# Patient Record
Sex: Male | Born: 2005 | Race: Black or African American | Hispanic: No | Marital: Single
Health system: Southern US, Community
[De-identification: ages and names within clinical notes are randomized; demographics above are authoritative.]

## PROBLEM LIST (undated history)

## (undated) DIAGNOSIS — F209 Schizophrenia, unspecified: Secondary | ICD-10-CM

## (undated) DIAGNOSIS — F319 Bipolar disorder, unspecified: Secondary | ICD-10-CM

## (undated) DIAGNOSIS — F840 Autistic disorder: Secondary | ICD-10-CM

## (undated) HISTORY — DX: Autistic disorder: F84.0

---
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
## 2024-01-19 ENCOUNTER — Ambulatory Visit (INDEPENDENT_AMBULATORY_CARE_PROVIDER_SITE_OTHER): Payer: MEDICAID

## 2024-01-19 ENCOUNTER — Encounter (HOSPITAL_COMMUNITY): Payer: Self-pay

## 2024-01-19 MED ORDER — PALIPERIDONE PALMITATE ER 156 MG/ML IM SUSY
156.0000 mg | PREFILLED_SYRINGE | Freq: Once | INTRAMUSCULAR | Status: AC
  Administered 2024-01-19: 10:00:00 156 mg via INTRAMUSCULAR

## 2024-01-19 NOTE — Progress Notes (Signed)
 Patient presented to office with caregiver for injection. Pt tolerated injection of invega  sustenna 156 mg in his LEFT deltoid with no complaints.  Pt presented well groomed and communicative during this appointment. Pt is doing well on current medications. Patient denies SI/HI/AVH. No additional complaints noted. Patient will return in 28 days.

## 2024-02-16 ENCOUNTER — Encounter (HOSPITAL_COMMUNITY): Payer: Self-pay

## 2024-02-16 ENCOUNTER — Ambulatory Visit (HOSPITAL_COMMUNITY): Payer: MEDICAID

## 2024-02-16 ENCOUNTER — Encounter (HOSPITAL_COMMUNITY): Payer: MEDICAID | Admitting: Psychiatry

## 2024-02-16 ENCOUNTER — Ambulatory Visit (INDEPENDENT_AMBULATORY_CARE_PROVIDER_SITE_OTHER): Payer: MEDICAID

## 2024-02-16 DIAGNOSIS — F200 Paranoid schizophrenia: Secondary | ICD-10-CM | POA: Diagnosis not present

## 2024-02-16 DIAGNOSIS — F411 Generalized anxiety disorder: Secondary | ICD-10-CM | POA: Diagnosis not present

## 2024-02-16 MED ORDER — PALIPERIDONE PALMITATE ER 156 MG/ML IM SUSY
156.0000 mg | PREFILLED_SYRINGE | Freq: Once | INTRAMUSCULAR | Status: AC
  Administered 2024-02-16: 156 mg via INTRAMUSCULAR

## 2024-02-16 NOTE — Progress Notes (Signed)
 Patient presented to office with caregiver for injection. Pt tolerated injection of invega  sustenna 156 mg in his Right deltoid with no complaints.  Pt presented well groomed and communicative during this appointment. Pt is doing well on current medications. Patient denies SI/HI/AVH. No additional complaints noted. Patient will return in 28 days.

## 2024-02-21 NOTE — Patient Instructions (Incomplete)

## 2024-02-29 ENCOUNTER — Ambulatory Visit: Payer: MEDICAID | Admitting: Nurse Practitioner

## 2024-03-04 ENCOUNTER — Encounter: Payer: Self-pay | Admitting: Nurse Practitioner

## 2024-03-04 ENCOUNTER — Ambulatory Visit (INDEPENDENT_AMBULATORY_CARE_PROVIDER_SITE_OTHER): Payer: MEDICAID | Admitting: Nurse Practitioner

## 2024-03-04 VITALS — BP 122/80 | HR 71 | Temp 98.5°F | Ht 67.2 in | Wt 216.2 lb

## 2024-03-04 DIAGNOSIS — Z7689 Persons encountering health services in other specified circumstances: Secondary | ICD-10-CM | POA: Diagnosis not present

## 2024-03-04 DIAGNOSIS — F200 Paranoid schizophrenia: Secondary | ICD-10-CM

## 2024-03-04 DIAGNOSIS — I10 Essential (primary) hypertension: Secondary | ICD-10-CM

## 2024-03-04 DIAGNOSIS — Z113 Encounter for screening for infections with a predominantly sexual mode of transmission: Secondary | ICD-10-CM | POA: Diagnosis not present

## 2024-03-04 DIAGNOSIS — Z87828 Personal history of other (healed) physical injury and trauma: Secondary | ICD-10-CM | POA: Insufficient documentation

## 2024-03-04 DIAGNOSIS — F840 Autistic disorder: Secondary | ICD-10-CM

## 2024-03-04 DIAGNOSIS — E669 Obesity, unspecified: Secondary | ICD-10-CM

## 2024-03-04 DIAGNOSIS — R7301 Impaired fasting glucose: Secondary | ICD-10-CM | POA: Diagnosis not present

## 2024-03-04 DIAGNOSIS — E782 Mixed hyperlipidemia: Secondary | ICD-10-CM

## 2024-03-04 DIAGNOSIS — D649 Anemia, unspecified: Secondary | ICD-10-CM | POA: Diagnosis not present

## 2024-03-04 DIAGNOSIS — Z1159 Encounter for screening for other viral diseases: Secondary | ICD-10-CM | POA: Diagnosis not present

## 2024-03-04 LAB — MICROALBUMIN, URINE WAIVED
Creatinine, Urine Waived: 300 mg/dL (ref 10–300)
Microalb, Ur Waived: 30 mg/L — ABNORMAL HIGH (ref 0–19)
Microalb/Creat Ratio: <30 mg/g

## 2024-03-04 NOTE — Assessment & Plan Note (Signed)
 Chronic, stable. Check labs today and add supplement as needed.

## 2024-03-04 NOTE — Assessment & Plan Note (Signed)
 Chronic, stable at present. Denies SI/HI. Follows with psychiatry, will continue this collaboration and medication as ordered by them. Recent note reviewed.

## 2024-03-04 NOTE — Assessment & Plan Note (Signed)
 Chronic, stable at present.  Follows with psychiatry, will continue this collaboration and medication as ordered by them. Recent note reviewed.

## 2024-03-04 NOTE — Assessment & Plan Note (Signed)
 Chronic, stable. BP at goal on recheck. Will continue Amlodipine  as ordered and if psychiatry requests PCP can takeover this medication, will maintain mental health medications with them. Recommend he monitor BP at least a few mornings a week at home and document.  DASH diet at home.  Labs today: CBC, CMP, TSH, urine ALB.

## 2024-03-04 NOTE — Assessment & Plan Note (Signed)
BMI 33.66.  Recommended eating smaller high protein, low fat meals more frequently and exercising 30 mins a day 5 times a week with a goal of 10-15lb weight loss in the next 3 months. Patient voiced their understanding and motivation to adhere to these recommendations. ? ?

## 2024-03-04 NOTE — Patient Instructions (Signed)
 Healthy Eating for Adults Healthy eating may help you get and keep a healthy body weight, reduce the risk of chronic disease, and live a long and productive life. It is important to follow a healthy eating pattern. Your nutritional and calorie needs should be met mainly by different nutrient-rich foods. What are tips for following this plan? Reading food labels Read labels and choose the following: Reduced or low sodium products. Juices with 100% fruit juice. Foods with low saturated fats (<3 g per serving) and high polyunsaturated and monounsaturated fats. Foods with whole grains, such as whole wheat, cracked wheat, brown rice, and wild rice. Whole grains that are fortified with folic acid. This is recommended for females who are pregnant or who want to become pregnant. Read labels and do not eat or drink the following: Foods or drinks with added sugars. These include foods that contain brown sugar, corn sweetener, corn syrup, dextrose, fructose, glucose, high-fructose corn syrup, honey, invert sugar, lactose, malt syrup, maltose, molasses, raw sugar, sucrose, trehalose, or turbinado sugar. Limit your intake of added sugars to less than 10% of your total daily calories. Do not eat more than the following amounts of added sugar per day: 6 teaspoons (25 g) for females. 9 teaspoons (38 g) for males. Foods that contain processed or refined starches and grains. Refined grain products, such as white flour, degermed cornmeal, white bread, and white rice. Shopping Choose nutrient-rich snacks, such as vegetables, whole fruits, and nuts. Avoid high-calorie and high-sugar snacks, such as potato chips, fruit snacks, and candy. Use oil-based dressings and spreads on foods instead of solid fats such as butter, margarine, sour cream, or cream cheese. Limit pre-made sauces, mixes, and instant products such as flavored rice, instant noodles, and ready-made pasta. Try more plant-protein sources, such as tofu,  tempeh, black beans, edamame, lentils, nuts, and seeds. Explore eating plans such as the Mediterranean diet or vegetarian diet. Try heart-healthy dips made with beans and healthy fats like hummus and guacamole. Vegetables go great with these. Cooking Use oil to saut or stir-fry foods instead of solid fats such as butter, margarine, or lard. Try baking, boiling, grilling, or broiling instead of frying. Remove the fatty part of meats before cooking. Steam vegetables in water or broth. Meal planning  At meals, imagine dividing your plate into fourths: One-half of your plate is fruits and vegetables. One-fourth of your plate is whole grains. One-fourth of your plate is protein, especially lean meats, poultry, eggs, tofu, beans, or nuts. Include low-fat dairy as part of your daily diet. Lifestyle Choose healthy options in all settings, including home, work, school, restaurants, or stores. Prepare your food safely: Wash your hands after handling raw meats. Where you prepare food, keep surfaces clean by regularly washing with hot, soapy water. Keep raw meats separate from ready-to-eat foods, such as fruits and vegetables. Cook seafood, meat, poultry, and eggs to the recommended temperature. Get a food thermometer. Store foods at safe temperatures. In general: Keep cold foods at 28F (4.4C) or below. Keep hot foods at 128F (60C) or above. Keep your freezer at [facility] (-17.8C) or below. Foods are not safe to eat if they have been between the temperatures of 40-128F (4.4-60C) for more than 2 hours. What foods should I eat? Fruits Aim to eat 1-2 cups of fresh, canned (in natural juice), or frozen fruits each day. One cup of fruit equals 1 small apple, 1 large banana, 8 large strawberries, 1 cup (237 g) canned fruit,  cup (82 g) dried  fruit, or 1 cup (240 mL) 100% juice. Vegetables Aim to eat 2-4 cups of fresh and frozen vegetables each day, including different varieties and colors. One cup  of vegetables equals 1 cup (91 g) broccoli or cauliflower florets, 2 medium carrots, 2 cups (150 g) raw, leafy greens, 1 large tomato, 1 large bell pepper, 1 large sweet potato, or 1 medium white potato. Grains Aim to eat 5-10 ounce-equivalents of whole grains each day. Examples of 1 ounce-equivalent of grains include 1 slice of bread, 1 cup (40 g) ready-to-eat cereal, 3 cups (24 g) popcorn, or  cup (93 g) cooked rice. Meats and other proteins Try to eat 5-7 ounce-equivalents of protein each day. Examples of 1 ounce-equivalent of protein include 1 egg,  oz nuts (12 almonds, 24 pistachios, or 7 walnut halves), 1/4 cup (90 g) cooked beans, 6 tablespoons (90 g) hummus or 1 tablespoon (16 g) peanut butter. A cut of meat or fish that is the size of a deck of cards is about 3-4 ounce-equivalents (85 g). Of the protein you eat each week, try to have at least 8 sounce (227 g) of seafood. This is about 2 servings per week. This includes salmon, trout, herring, sardines, and anchovies. Dairy Aim to eat 3 cup-equivalents of fat-free or low-fat dairy each day. Examples of 1 cup-equivalent of dairy include 1 cup (240 mL) milk, 8 ounces (250 g) yogurt, 1 ounces (44 g) natural cheese, or 1 cup (240 mL) fortified soy milk. Fats and oils Aim for about 5 teaspoons (21 g) of fats and oils per day. Choose monounsaturated fats, such as canola and olive oils, mayonnaise made with olive oil or avocado oil, avocados, peanut butter, and most nuts, or polyunsaturated fats, such as sunflower, corn, and soybean oils, walnuts, pine nuts, sesame seeds, sunflower seeds, and flaxseed. Beverages Aim for 6 eight-ounce glasses of water per day. Limit coffee to 3-5 eight-ounce cups per day. Limit caffeinated beverages that have added calories, such as soda and energy drinks. If you drink alcohol: Limit how much you have to: 0-1 drink a day if you are male. 0-2 drinks a day if you are male. Know how much alcohol is in your drink.  In the [place] one drink is one 12 oz bottle of beer (355 mL), one 5 oz glass of wine (148 mL), or one 1 oz glass of hard liquor (44 mL). Seasoning and other foods Try not to add too much salt to your food. Try using herbs and spices instead of salt. Try not to add sugar to food. This information is based on [place] nutrition guidelines. To learn more, visit disposablenylon.be. Exact amounts may vary. You may need different amounts. This information is not intended to replace advice given to you by your health care provider. Make sure you discuss any questions you have with your health care provider. Document Revised: 11/30/2023 Document Reviewed: 10/21/2021 Elsevier Patient Education  2025 [employer].

## 2024-03-04 NOTE — Assessment & Plan Note (Signed)
 Chronic, stable. Check labs today and recommend heavy focus on healthy diet choices and regular activity.

## 2024-03-04 NOTE — Assessment & Plan Note (Signed)
 To right forearm, overall stable with no reduction in ROM or pain.

## 2024-03-05 ENCOUNTER — Ambulatory Visit: Payer: Self-pay | Admitting: Nurse Practitioner

## 2024-03-05 LAB — COMPREHENSIVE METABOLIC PANEL WITH GFR
ALT: 13 [IU]/L (ref 0–44)
AST: 21 [IU]/L (ref 0–40)
Albumin: 4.5 g/dL (ref 4.3–5.2)
Alkaline Phosphatase: 88 [IU]/L (ref 51–125)
BUN/Creatinine Ratio: 9 (ref 9–20)
BUN: 8 mg/dL (ref 6–20)
Bilirubin Total: 0.3 mg/dL (ref 0.0–1.2)
CO2: 24 mmol/L (ref 20–29)
Calcium: 9.8 mg/dL (ref 8.7–10.2)
Chloride: 102 mmol/L (ref 96–106)
Creatinine, Ser: 0.92 mg/dL (ref 0.76–1.27)
Globulin, Total: 3.2 g/dL (ref 1.5–4.5)
Glucose: 81 mg/dL (ref 70–99)
Potassium: 4.2 mmol/L (ref 3.5–5.2)
Sodium: 142 mmol/L (ref 134–144)
Total Protein: 7.7 g/dL (ref 6.0–8.5)
eGFR: 123 mL/min/{1.73_m2}

## 2024-03-05 LAB — LIPID PANEL W/O CHOL/HDL RATIO
Cholesterol, Total: 209 mg/dL — ABNORMAL HIGH (ref 100–169)
HDL: 61 mg/dL
LDL Chol Calc (NIH): 133 mg/dL — ABNORMAL HIGH (ref 0–109)
Triglycerides: 86 mg/dL (ref 0–89)
VLDL Cholesterol Cal: 15 mg/dL (ref 5–40)

## 2024-03-05 LAB — CBC WITH DIFFERENTIAL/PLATELET
Basophils Absolute: 0.1 10*3/uL (ref 0.0–0.2)
Basos: 1 %
EOS (ABSOLUTE): 0.1 10*3/uL (ref 0.0–0.4)
Eos: 2 %
Hematocrit: 48.6 % (ref 37.5–51.0)
Hemoglobin: 15.9 g/dL (ref 13.0–17.7)
Immature Grans (Abs): 0 10*3/uL (ref 0.0–0.1)
Immature Granulocytes: 0 %
Lymphocytes Absolute: 2.7 10*3/uL (ref 0.7–3.1)
Lymphs: 41 %
MCH: 29.4 pg (ref 26.6–33.0)
MCHC: 32.7 g/dL (ref 31.5–35.7)
MCV: 90 fL (ref 79–97)
Monocytes Absolute: 0.3 10*3/uL (ref 0.1–0.9)
Monocytes: 5 %
Neutrophils Absolute: 3.4 10*3/uL (ref 1.4–7.0)
Neutrophils: 51 %
Platelets: 419 10*3/uL (ref 150–450)
RBC: 5.41 x10E6/uL (ref 4.14–5.80)
RDW: 13.9 % (ref 11.6–15.4)
WBC: 6.5 10*3/uL (ref 3.4–10.8)

## 2024-03-05 LAB — FERRITIN: Ferritin: 53 ng/mL (ref 16–124)

## 2024-03-05 LAB — HEPATITIS C ANTIBODY: Hep C Virus Ab: NONREACTIVE

## 2024-03-05 LAB — IRON: Iron: 108 ug/dL (ref 38–169)

## 2024-03-05 LAB — HIV ANTIBODY (ROUTINE TESTING W REFLEX): HIV Screen 4th Generation wRfx: NONREACTIVE

## 2024-03-05 LAB — HSV 1 AND 2 AB, IGG
HSV 1 Glycoprotein G Ab, IgG: NONREACTIVE
HSV 2 IgG, Type Spec: NONREACTIVE

## 2024-03-05 LAB — SYPHILIS: RPR W/REFLEX TO RPR TITER AND TREPONEMAL ANTIBODIES, TRADITIONAL SCREENING AND DIAGNOSIS ALGORITHM: RPR Ser Ql: NONREACTIVE

## 2024-03-05 LAB — HEMOGLOBIN A1C
Est. average glucose Bld gHb Est-mCnc: 111 mg/dL
Hgb A1c MFr Bld: 5.5 % (ref 4.8–5.6)

## 2024-03-05 LAB — TSH: TSH: 4.43 u[IU]/mL (ref 0.450–4.500)

## 2024-03-07 LAB — GC/CHLAMYDIA PROBE AMP
Chlamydia trachomatis, NAA: NEGATIVE
Neisseria Gonorrhoeae by PCR: NEGATIVE

## 2024-03-07 NOTE — Progress Notes (Signed)
 Good morning, please let Kyle Mendez's guardian know labs have returned and overall are reassuring. The only area that could be worked on is his lipid panel, which is showing elevations in total cholesterol and LDL, bad cholesterol, for this it is important to work on [employer] choices and regular exercise. STD testing that was requested is all negative. Any questions? Keep being stellar!!  Thank you for allowing me to participate in your care.  I appreciate you. Kindest regards, [place] Doig

## 2024-03-08 NOTE — Telephone Encounter (Signed)
 See result note.

## 2024-03-08 NOTE — Telephone Encounter (Unsigned)
 Copied from CRM [id number]. Topic: Clinical - Lab/Test Results >> Mar 08, 2024  2:03 PM Avram MATSU wrote: Reason for CRM: I relayed the test results to the patient

## 2024-03-15 ENCOUNTER — Ambulatory Visit (HOSPITAL_COMMUNITY): Payer: MEDICAID

## 2024-07-13 ENCOUNTER — Ambulatory Visit: Payer: MEDICAID | Admitting: Nurse Practitioner

## 2025-03-06 ENCOUNTER — Encounter: Payer: MEDICAID | Admitting: Nurse Practitioner
